# Patient Record
Sex: Female | Born: 1935 | Race: White | Hispanic: No | Marital: Married | Smoking: Never smoker
Health system: Southern US, Community
[De-identification: ages and names within clinical notes are randomized; demographics above are authoritative.]

## PROBLEM LIST (undated history)

## (undated) DIAGNOSIS — E039 Hypothyroidism, unspecified: Secondary | ICD-10-CM

## (undated) DIAGNOSIS — E119 Type 2 diabetes mellitus without complications: Secondary | ICD-10-CM

## (undated) DIAGNOSIS — T8859XA Other complications of anesthesia, initial encounter: Secondary | ICD-10-CM

## (undated) DIAGNOSIS — E7800 Pure hypercholesterolemia, unspecified: Secondary | ICD-10-CM

## (undated) DIAGNOSIS — T4145XA Adverse effect of unspecified anesthetic, initial encounter: Secondary | ICD-10-CM

## (undated) DIAGNOSIS — N189 Chronic kidney disease, unspecified: Secondary | ICD-10-CM

## (undated) DIAGNOSIS — M810 Age-related osteoporosis without current pathological fracture: Secondary | ICD-10-CM

## (undated) HISTORY — PX: ABDOMINAL HYSTERECTOMY: SHX81

---

## 2004-06-05 ENCOUNTER — Ambulatory Visit: Payer: Self-pay | Admitting: Unknown Physician Specialty

## 2005-09-17 ENCOUNTER — Ambulatory Visit: Payer: Self-pay | Admitting: Unknown Physician Specialty

## 2006-10-22 ENCOUNTER — Ambulatory Visit: Payer: Self-pay | Admitting: Unknown Physician Specialty

## 2008-01-14 ENCOUNTER — Ambulatory Visit: Payer: Self-pay | Admitting: Unknown Physician Specialty

## 2009-03-06 ENCOUNTER — Ambulatory Visit: Payer: Self-pay | Admitting: Unknown Physician Specialty

## 2009-08-28 DEATH — deceased

## 2010-05-30 ENCOUNTER — Ambulatory Visit: Payer: Self-pay | Admitting: Unknown Physician Specialty

## 2011-06-11 ENCOUNTER — Ambulatory Visit: Payer: Self-pay | Admitting: Unknown Physician Specialty

## 2013-03-09 ENCOUNTER — Ambulatory Visit: Payer: Self-pay | Admitting: Internal Medicine

## 2014-05-06 ENCOUNTER — Ambulatory Visit
Admit: 2014-05-06 | Disposition: A | Discharge status: Home / Self Care | Payer: Self-pay | Attending: Internal Medicine | Admitting: Internal Medicine

## 2014-07-28 ENCOUNTER — Encounter
Admission: RE | Admit: 2014-07-28 | Discharge: 2014-07-28 | Disposition: A | Discharge status: Home / Self Care | Payer: Medicare Other | Source: Ambulatory Visit | Attending: Ophthalmology | Admitting: Ophthalmology

## 2014-07-28 DIAGNOSIS — E119 Type 2 diabetes mellitus without complications: Secondary | ICD-10-CM | POA: Diagnosis not present

## 2014-07-28 DIAGNOSIS — M810 Age-related osteoporosis without current pathological fracture: Secondary | ICD-10-CM | POA: Diagnosis not present

## 2014-07-28 DIAGNOSIS — E780 Pure hypercholesterolemia: Secondary | ICD-10-CM | POA: Diagnosis not present

## 2014-07-28 DIAGNOSIS — H2511 Age-related nuclear cataract, right eye: Secondary | ICD-10-CM | POA: Diagnosis not present

## 2014-07-28 DIAGNOSIS — I10 Essential (primary) hypertension: Secondary | ICD-10-CM | POA: Diagnosis not present

## 2014-07-28 LAB — POTASSIUM: POTASSIUM: 4.4 mmol/L (ref 3.5–5.1)

## 2014-08-05 DIAGNOSIS — H2511 Age-related nuclear cataract, right eye: Secondary | ICD-10-CM | POA: Diagnosis not present

## 2014-08-07 NOTE — H&P (Signed)
  History and physical was faxed and scanned in.   

## 2014-08-08 ENCOUNTER — Ambulatory Visit: Payer: Medicare Other | Admitting: *Deleted

## 2014-08-08 ENCOUNTER — Encounter
Admission: RE | Disposition: A | Discharge status: Home / Self Care | Payer: Self-pay | Source: Ambulatory Visit | Attending: Ophthalmology

## 2014-08-08 ENCOUNTER — Ambulatory Visit
Admission: RE | Admit: 2014-08-08 | Discharge: 2014-08-08 | Disposition: A | Discharge status: Home / Self Care | Payer: Medicare Other | Source: Ambulatory Visit | Attending: Ophthalmology | Admitting: Ophthalmology

## 2014-08-08 ENCOUNTER — Encounter: Payer: Self-pay | Admitting: *Deleted

## 2014-08-08 DIAGNOSIS — E780 Pure hypercholesterolemia: Secondary | ICD-10-CM | POA: Insufficient documentation

## 2014-08-08 DIAGNOSIS — I10 Essential (primary) hypertension: Secondary | ICD-10-CM | POA: Insufficient documentation

## 2014-08-08 DIAGNOSIS — H2511 Age-related nuclear cataract, right eye: Secondary | ICD-10-CM | POA: Insufficient documentation

## 2014-08-08 DIAGNOSIS — M810 Age-related osteoporosis without current pathological fracture: Secondary | ICD-10-CM | POA: Insufficient documentation

## 2014-08-08 DIAGNOSIS — E119 Type 2 diabetes mellitus without complications: Secondary | ICD-10-CM | POA: Insufficient documentation

## 2014-08-08 HISTORY — DX: Age-related osteoporosis without current pathological fracture: M81.0

## 2014-08-08 HISTORY — DX: Type 2 diabetes mellitus without complications: E11.9

## 2014-08-08 HISTORY — DX: Pure hypercholesterolemia, unspecified: E78.00

## 2014-08-08 HISTORY — DX: Other complications of anesthesia, initial encounter: T88.59XA

## 2014-08-08 HISTORY — PX: CATARACT EXTRACTION W/PHACO: SHX586

## 2014-08-08 HISTORY — DX: Hypothyroidism, unspecified: E03.9

## 2014-08-08 HISTORY — DX: Chronic kidney disease, unspecified: N18.9

## 2014-08-08 HISTORY — DX: Adverse effect of unspecified anesthetic, initial encounter: T41.45XA

## 2014-08-08 LAB — GLUCOSE, CAPILLARY: Glucose-Capillary: 130 mg/dL — ABNORMAL HIGH (ref 65–99)

## 2014-08-08 SURGERY — PHACOEMULSIFICATION, CATARACT, WITH IOL INSERTION
Anesthesia: Monitor Anesthesia Care | Site: Eye | Laterality: Right | Wound class: Clean

## 2014-08-08 MED ORDER — PHENYLEPHRINE HCL 10 % OP SOLN
1.0000 [drp] | OPHTHALMIC | Status: DC | PRN
  Administered 2014-08-08: 1 [drp] via OPHTHALMIC

## 2014-08-08 MED ORDER — MOXIFLOXACIN HCL 0.5 % OP SOLN
OPHTHALMIC | Status: AC
  Administered 2014-08-08: 1 [drp] via OPHTHALMIC
  Filled 2014-08-08: qty 3

## 2014-08-08 MED ORDER — NA CHONDROIT SULF-NA HYALURON 40-17 MG/ML IO SOLN
INTRAOCULAR | Status: AC
  Filled 2014-08-08: qty 1

## 2014-08-08 MED ORDER — ALFENTANIL 500 MCG/ML IJ INJ
INJECTION | INTRAMUSCULAR | Status: DC | PRN
Start: 2014-08-08 — End: 2014-08-08
  Administered 2014-08-08: 500 ug via INTRAVENOUS

## 2014-08-08 MED ORDER — EPINEPHRINE HCL 1 MG/ML IJ SOLN
INTRAMUSCULAR | Status: DC | PRN
  Administered 2014-08-08: .5 mL via OPHTHALMIC

## 2014-08-08 MED ORDER — CEFUROXIME OPHTHALMIC INJECTION 1 MG/0.1 ML
INJECTION | OPHTHALMIC | Status: AC
  Filled 2014-08-08: qty 0.1

## 2014-08-08 MED ORDER — MIDAZOLAM HCL 2 MG/2ML IJ SOLN
INTRAMUSCULAR | Status: DC | PRN
  Administered 2014-08-08: 1 mg via INTRAVENOUS

## 2014-08-08 MED ORDER — CARBACHOL 0.01 % IO SOLN
INTRAOCULAR | Status: DC | PRN
  Administered 2014-08-08: 0.5 mL via INTRAOCULAR

## 2014-08-08 MED ORDER — HYALURONIDASE HUMAN 150 UNIT/ML IJ SOLN
INTRAMUSCULAR | Status: AC
Start: 2014-08-08 — End: 2014-08-08
  Filled 2014-08-08: qty 1

## 2014-08-08 MED ORDER — LIDOCAINE HCL (PF) 4 % IJ SOLN
INTRAMUSCULAR | Status: AC
  Filled 2014-08-08: qty 5

## 2014-08-08 MED ORDER — MOXIFLOXACIN HCL 0.5 % OP SOLN - NO CHARGE
OPHTHALMIC | Status: DC | PRN
  Administered 2014-08-08: 1 [drp] via OPHTHALMIC

## 2014-08-08 MED ORDER — CEFUROXIME OPHTHALMIC INJECTION 1 MG/0.1 ML
INJECTION | OPHTHALMIC | Status: DC | PRN
  Administered 2014-08-08: 0.1 mL via INTRACAMERAL

## 2014-08-08 MED ORDER — EPINEPHRINE HCL 1 MG/ML IJ SOLN
INTRAMUSCULAR | Status: AC
  Filled 2014-08-08: qty 1

## 2014-08-08 MED ORDER — CYCLOPENTOLATE HCL 2 % OP SOLN
1.0000 [drp] | OPHTHALMIC | Status: DC | PRN
  Administered 2014-08-08: 1 [drp] via OPHTHALMIC

## 2014-08-08 MED ORDER — EPINEPHRINE HCL 1 MG/ML IJ SOLN
INTRAOCULAR | Status: DC | PRN
  Administered 2014-08-08: 200 mL

## 2014-08-08 MED ORDER — PHENYLEPHRINE HCL 10 % OP SOLN
OPHTHALMIC | Status: AC
  Administered 2014-08-08: 1 [drp] via OPHTHALMIC
  Filled 2014-08-08: qty 5

## 2014-08-08 MED ORDER — CYCLOPENTOLATE HCL 2 % OP SOLN
OPHTHALMIC | Status: AC
  Administered 2014-08-08: 1 [drp] via OPHTHALMIC
  Filled 2014-08-08: qty 2

## 2014-08-08 MED ORDER — TETRACAINE HCL 0.5 % OP SOLN
OPHTHALMIC | Status: DC | PRN
  Administered 2014-08-08: 1 [drp] via OPHTHALMIC

## 2014-08-08 MED ORDER — SODIUM CHLORIDE 0.9 % IV SOLN
INTRAVENOUS | Status: DC
  Administered 2014-08-08: 07:00:00 via INTRAVENOUS

## 2014-08-08 MED ORDER — MOXIFLOXACIN HCL 0.5 % OP SOLN
1.0000 [drp] | OPHTHALMIC | Status: DC | PRN
  Administered 2014-08-08: 1 [drp] via OPHTHALMIC

## 2014-08-08 MED ORDER — BUPIVACAINE HCL (PF) 0.75 % IJ SOLN
INTRAMUSCULAR | Status: AC
  Filled 2014-08-08: qty 10

## 2014-08-08 MED ORDER — TETRACAINE HCL 0.5 % OP SOLN
OPHTHALMIC | Status: AC
Start: 2014-08-08 — End: 2014-08-08
  Filled 2014-08-08: qty 2

## 2014-08-08 MED ORDER — NA CHONDROIT SULF-NA HYALURON 40-17 MG/ML IO SOLN
INTRAOCULAR | Status: DC | PRN
  Administered 2014-08-08: 1 mL via INTRAOCULAR

## 2014-08-08 MED ORDER — PROPARACAINE HCL 0.5 % OP SOLN: OPHTHALMIC | Status: DC | PRN

## 2014-08-08 SURGICAL SUPPLY — 25 items
CORD BIP STRL DISP 12FT (MISCELLANEOUS) ×3 IMPLANT
DRAPE XRAY CASSETTE 23X24 (DRAPES) ×3 IMPLANT
ERASER HMR WETFIELD 18G (MISCELLANEOUS) ×3 IMPLANT
GLOVE BIO SURGEON STRL SZ8 (GLOVE) ×3 IMPLANT
GLOVE SURG LX 6.5 MICRO (GLOVE) ×2
GLOVE SURG LX 8.0 MICRO (GLOVE) ×2
GLOVE SURG LX STRL 6.5 MICRO (GLOVE) ×1 IMPLANT
GLOVE SURG LX STRL 8.0 MICRO (GLOVE) ×1 IMPLANT
GOWN STRL REUS W/ TWL LRG LVL3 (GOWN DISPOSABLE) ×1 IMPLANT
GOWN STRL REUS W/ TWL XL LVL3 (GOWN DISPOSABLE) ×1 IMPLANT
GOWN STRL REUS W/TWL LRG LVL3 (GOWN DISPOSABLE) ×2
GOWN STRL REUS W/TWL XL LVL3 (GOWN DISPOSABLE) ×2
LENS IOL ACRYSERT 20.0 (Intraocular Lens) ×3 IMPLANT
PACK CATARACT (MISCELLANEOUS) ×3 IMPLANT
PACK CATARACT DINGLEDEIN LX (MISCELLANEOUS) ×3 IMPLANT
PACK EYE AFTER SURG (MISCELLANEOUS) ×3 IMPLANT
SHLD EYE VISITEC  UNIV (MISCELLANEOUS) ×3 IMPLANT
SOL PREP PVP 2OZ (MISCELLANEOUS) ×3
SOLUTION PREP PVP 2OZ (MISCELLANEOUS) ×1 IMPLANT
SUT SILK 5-0 (SUTURE) ×3 IMPLANT
SYR 5ML LL (SYRINGE) ×3 IMPLANT
SYR TB 1ML 27GX1/2 LL (SYRINGE) ×3 IMPLANT
WATER STERILE IRR 1000ML POUR (IV SOLUTION) ×3 IMPLANT
WIPE NON LINTING 3.25X3.25 (MISCELLANEOUS) ×3 IMPLANT
sn6cws 20.0 acrysof lens ×3 IMPLANT

## 2014-08-08 NOTE — Interval H&P Note (Signed)
History and Physical Interval Note:  08/08/2014 7:20 AM  Julie ProudHelen D Olver  has presented today for surgery, with the diagnosis of nuclear sclerotic cataract right eye  The various methods of treatment have been discussed with the patient and family. After consideration of risks, benefits and other options for treatment, the patient has consented to  Procedure(s) with comments: CATARACT EXTRACTION PHACO AND INTRAOCULAR LENS PLACEMENT (IOC) (Right) - US AP% CDE fluid pack lot # [id number] as a surgical intervention .  The patient's history has been reviewed, patient examined, no change in status, stable for surgery.  I have reviewed the patient's chart and labs.  Questions were answered to the patient's satisfaction.     Tosca Pletz

## 2014-08-08 NOTE — Anesthesia Postprocedure Evaluation (Signed)
  Anesthesia Post-op Note  Patient: Julie Buckley  Procedure(s) Performed: Procedure(s) with comments: CATARACT EXTRACTION PHACO AND INTRAOCULAR LENS PLACEMENT (IOC) (Right) - US 01:46 AP% 61.2 CDE 50.75 fluid pack lot # [id number]  Anesthesia type:MAC  Patient location: PACU  Post pain: Pain level controlled  Post assessment: Post-op Vital signs reviewed, Patient's Cardiovascular Status Stable, Respiratory Function Stable, Patent Airway and No signs of Nausea or vomiting  Post vital signs: Reviewed and stable  Last Vitals:  Filed Vitals:   08/08/14 0821  BP: 138/51  Pulse: 65  Temp:   Resp: 16    Level of consciousness: awake, alert  and patient cooperative  Complications: No apparent anesthesia complications

## 2014-08-08 NOTE — Transfer of Care (Signed)
Immediate Anesthesia Transfer of Care Note  Patient: Julie Buckley  Procedure(s) Performed: Procedure(s) with comments: CATARACT EXTRACTION PHACO AND INTRAOCULAR LENS PLACEMENT (IOC) (Right) - US 01:46 AP% 61.2 CDE 50.75 fluid pack lot # [id number]  Patient Location: PACU  Anesthesia Type:MAC  Level of Consciousness: awake  Airway & Oxygen Therapy: Patient Spontanous Breathing and Patient connected to nasal cannula oxygen  Post-op Assessment: Report given to RN and Post -op Vital signs reviewed and stable  Post vital signs:   Last Vitals:  99% hr 65 resp16 bp 138/51 temp 97.2  Complications: No apparent anesthesia complications

## 2014-08-08 NOTE — Op Note (Signed)
Date of Surgery: 08/08/2014 Date of Dictation: 08/08/2014 8:16 AM Pre-operative Diagnosis:  Nuclear Sclerotic Cataract right Eye Post-operative Diagnosis: same Procedure performed: Extra-capsular Cataract Extraction (ECCE) with placement of a posterior chamber intraocular lens (IOL) right Eye IOL:  Implant Name Type Inv. Item Serial No. Manufacturer Lot No. LRB No. Used  [id number] 20.0 acrysof lens     [id number]     Right 1   Anesthesia: 2% Lidocaine and 4% Marcaine in a 50/50 mixture with 10 unites/ml of Hylenex given as a peribulbar Anesthesiologist: Anesthesiologist: Linward NatalAmy Rice, MD CRNA: Henrietta HooverKimberly Pope, CRNA Complications: none Estimated Blood Loss: less than 1 ml  Description of procedure:  The patient was given anesthesia and sedation via intravenous access. The patient was then prepped and draped in the usual fashion. A 25-gauge needle was bent for initiating the capsulorhexis. A 5-0 silk suture was placed through the conjunctiva superior and inferiorly to serve as bridle sutures. Hemostasis was obtained at the superior limbus using an eraser cautery. A partial thickness groove was made at the anterior surgical limbus with a 64 Beaver blade and this was dissected anteriorly with an [employer] Crescent knife. The anterior chamber was entered at 10 o'clock with a 1.0 mm paracentesis knife and through the lamellar dissection with a 2.6 mm Alcon keratome. DiscoVisc was injected to replace the aqueous and a continuous tear curvilinear capsulorhexis was performed using a bent 25-gauge needle.  Balance salt on a syringe was used to perform hydro-dissection and phacoemulsification was carried out using a divide and conquer technique. Procedure(s) with comments: CATARACT EXTRACTION PHACO AND INTRAOCULAR LENS PLACEMENT (IOC) (Right) - US 01:46 AP% 61.2 CDE 50.75 fluid pack lot # [id number]. Irrigation/aspiration was used to remove the residual cortex and the capsular bag was inflated with DiscoVisc. The  intraocular lens was inserted into the capsular bag using a pre-loaded Monarch Delivery System. Irrigation/aspiration was used to remove the residual DiscoVisc. The wound was inflated with balanced salt and checked for leaks. None were found. Miostat was injected via the paracentesis track and 0.1 ml of cefuroxime containing 1 mg of drug  was injected via the paracentesis track. The wound was checked for leaks again and none were found.   The bridal sutures were removed and two drops of Vigamox were placed on the eye. An eye shield was placed to protect the eye and the patient was discharged to the recovery area in good condition.   Lissie Hinesley MD

## 2014-08-08 NOTE — Anesthesia Preprocedure Evaluation (Signed)
Anesthesia Evaluation  Patient identified by MRN, date of birth, ID band Patient awake    Reviewed: Allergy & Precautions, NPO status , Patient's Chart, lab work & pertinent test results  Airway Mallampati: II  TM Distance: >3 FB Neck ROM: Limited    Dental  (+) Upper Dentures   Pulmonary    Pulmonary exam normal       Cardiovascular Exercise Tolerance: Good Normal cardiovascular exam    Neuro/Psych    GI/Hepatic   Endo/Other  diabetes, Type 2BG 130.  Renal/GU      Musculoskeletal   Abdominal Normal abdominal exam  (+)   Peds  Hematology   Anesthesia Other Findings   Reproductive/Obstetrics                             Anesthesia Physical Anesthesia Plan  ASA: III  Anesthesia Plan: MAC   Post-op Pain Management:    Induction: Intravenous  Airway Management Planned: Nasal Cannula  Additional Equipment:   Intra-op Plan:   Post-operative Plan:   Informed Consent: I have reviewed the patients History and Physical, chart, labs and discussed the procedure including the risks, benefits and alternatives for the proposed anesthesia with the patient or authorized representative who has indicated his/her understanding and acceptance.     Plan Discussed with: CRNA  Anesthesia Plan Comments:         Anesthesia Quick Evaluation

## 2014-08-08 NOTE — Anesthesia Procedure Notes (Signed)
Procedure Name: MAC Date/Time: 08/08/2014 7:45 AM Performed by: Henrietta HooverPOPE, Nikkie Liming Pre-anesthesia Checklist: Patient identified, Emergency Drugs available, Suction available, Patient being monitored and Timeout performed Patient Re-evaluated:Patient Re-evaluated prior to inductionOxygen Delivery Method: Nasal cannula

## 2014-08-08 NOTE — Discharge Instructions (Signed)
Cataract Surgery °Care After °Refer to this sheet in the next few weeks. These instructions provide you with information on caring for yourself after your procedure. Your caregiver may also give you more specific instructions. Your treatment has been planned according to current medical practices, but problems sometimes occur. Call your caregiver if you have any problems or questions after your procedure.  °HOME CARE INSTRUCTIONS  °· Avoid strenuous activities as directed by your caregiver. °· Ask your caregiver when you can resume driving. °· Use eyedrops or other medicines to help healing and control pressure inside your eye as directed by your caregiver. °· Only take over-the-counter or prescription medicines for pain, discomfort, or fever as directed by your caregiver. °· Do not to touch or rub your eyes. °· You may be instructed to use a protective shield during the first few days and nights after surgery. If not, wear sunglasses to protect your eyes. This is to protect the eye from pressure or from being accidentally bumped. °· Keep the area around your eye clean and dry. Avoid swimming or allowing water to hit you directly in the face while showering. Keep soap and shampoo out of your eyes. °· Do not bend or lift heavy objects. Bending increases pressure in the eye. You can walk, climb stairs, and do light household chores. °· Do not put a contact lens into the eye that had surgery until your caregiver says it is okay to do so. °· Ask your doctor when you can return to work. This will depend on the kind of work that you do. If you work in a dusty environment, you may be advised to wear protective eyewear for a period of time. °· Ask your caregiver when it will be safe to engage in sexual activity. °· Continue with your regular eye exams as directed by your caregiver. °What to expect: °· It is normal to feel itching and mild discomfort for a few days after cataract surgery. Some fluid discharge is also common,  and your eye may be sensitive to light and touch. °· After 1 to 2 days, even moderate discomfort should disappear. In most cases, healing will take about 6 weeks. °· If you received an intraocular lens (IOL), you may notice that colors are very bright or have a blue tinge. Also, if you have been in bright sunlight, everything may appear reddish for a few hours. If you see these color tinges, it is because your lens is clear and no longer cloudy. Within a few months after receiving an IOL, these extra colors should go away. When you have healed, you will probably need new glasses. °SEEK MEDICAL CARE IF:  °· You have increased bruising around your eye. °· You have discomfort not helped by medicine. °SEEK IMMEDIATE MEDICAL CARE IF:  °· You have a  fever. °· You have a worsening or sudden vision loss. °· You have redness, swelling, or increasing pain in the eye. °· You have a thick discharge from the eye that had surgery. °MAKE SURE YOU: °· Understand these instructions. °· Will watch your condition. °· Will get help right away if you are not doing well or get worse. °Document Released: 08/03/2004 Document Revised: 04/08/2011 Document Reviewed: 09/07/2010 °[employer]® Patient Information ©2015 [employer]. This information is not intended to replace advice given to you by your health care provider. Make sure you discuss any questions you have with your health care provider. ° °

## 2015-03-22 ENCOUNTER — Encounter: Payer: Self-pay | Admitting: Ophthalmology

## 2015-07-14 ENCOUNTER — Other Ambulatory Visit: Payer: Self-pay | Admitting: Internal Medicine

## 2015-07-14 DIAGNOSIS — Z1231 Encounter for screening mammogram for malignant neoplasm of breast: Secondary | ICD-10-CM

## 2015-07-20 ENCOUNTER — Ambulatory Visit
Admission: RE | Admit: 2015-07-20 | Discharge: 2015-07-20 | Disposition: A | Discharge status: Home / Self Care | Payer: Medicare Other | Source: Ambulatory Visit | Attending: Internal Medicine | Admitting: Internal Medicine

## 2015-07-20 ENCOUNTER — Other Ambulatory Visit: Payer: Self-pay | Admitting: Internal Medicine

## 2015-07-20 DIAGNOSIS — Z1231 Encounter for screening mammogram for malignant neoplasm of breast: Secondary | ICD-10-CM | POA: Insufficient documentation

## 2016-05-30 ENCOUNTER — Other Ambulatory Visit: Payer: Self-pay | Admitting: Internal Medicine

## 2016-05-30 DIAGNOSIS — Z1231 Encounter for screening mammogram for malignant neoplasm of breast: Secondary | ICD-10-CM

## 2016-07-22 ENCOUNTER — Ambulatory Visit
Admission: RE | Admit: 2016-07-22 | Discharge: 2016-07-22 | Disposition: A | Discharge status: Home / Self Care | Payer: Medicare Other | Source: Ambulatory Visit | Attending: Internal Medicine | Admitting: Internal Medicine

## 2016-07-22 DIAGNOSIS — Z1231 Encounter for screening mammogram for malignant neoplasm of breast: Secondary | ICD-10-CM | POA: Insufficient documentation

## 2017-05-14 ENCOUNTER — Other Ambulatory Visit: Payer: Self-pay | Admitting: Internal Medicine

## 2017-05-14 DIAGNOSIS — Z1231 Encounter for screening mammogram for malignant neoplasm of breast: Secondary | ICD-10-CM

## 2017-08-05 ENCOUNTER — Ambulatory Visit
Admission: RE | Admit: 2017-08-05 | Discharge: 2017-08-05 | Disposition: A | Discharge status: Home / Self Care | Payer: Medicare Other | Source: Ambulatory Visit | Attending: Internal Medicine | Admitting: Internal Medicine

## 2017-08-05 DIAGNOSIS — Z1231 Encounter for screening mammogram for malignant neoplasm of breast: Secondary | ICD-10-CM | POA: Diagnosis present

## 2018-08-26 ENCOUNTER — Other Ambulatory Visit: Payer: Self-pay | Admitting: Physician Assistant

## 2018-08-26 DIAGNOSIS — Z1231 Encounter for screening mammogram for malignant neoplasm of breast: Secondary | ICD-10-CM

## 2018-10-01 ENCOUNTER — Ambulatory Visit
Admission: RE | Admit: 2018-10-01 | Discharge: 2018-10-01 | Disposition: A | Discharge status: Home / Self Care | Payer: Medicare Other | Source: Ambulatory Visit | Attending: Physician Assistant | Admitting: Physician Assistant

## 2018-10-01 DIAGNOSIS — Z1231 Encounter for screening mammogram for malignant neoplasm of breast: Secondary | ICD-10-CM

## 2019-08-26 ENCOUNTER — Other Ambulatory Visit: Payer: Self-pay | Admitting: Physician Assistant

## 2019-08-26 DIAGNOSIS — Z1231 Encounter for screening mammogram for malignant neoplasm of breast: Secondary | ICD-10-CM

## 2019-10-07 ENCOUNTER — Ambulatory Visit
Admission: RE | Admit: 2019-10-07 | Discharge: 2019-10-07 | Disposition: A | Discharge status: Home / Self Care | Payer: Medicare Other | Source: Ambulatory Visit | Attending: Physician Assistant | Admitting: Physician Assistant

## 2019-10-07 DIAGNOSIS — Z1231 Encounter for screening mammogram for malignant neoplasm of breast: Secondary | ICD-10-CM | POA: Diagnosis not present

## 2021-01-10 ENCOUNTER — Other Ambulatory Visit: Payer: Self-pay

## 2021-01-10 ENCOUNTER — Emergency Department
Admission: EM | Admit: 2021-01-10 | Discharge: 2021-01-10 | Disposition: A | Discharge status: Home / Self Care | Payer: Medicare Other | Attending: Emergency Medicine | Admitting: Emergency Medicine

## 2021-01-10 ENCOUNTER — Emergency Department: Payer: Medicare Other

## 2021-01-10 DIAGNOSIS — Y93K1 Activity, walking an animal: Secondary | ICD-10-CM | POA: Diagnosis not present

## 2021-01-10 DIAGNOSIS — S0181XA Laceration without foreign body of other part of head, initial encounter: Secondary | ICD-10-CM

## 2021-01-10 DIAGNOSIS — E1122 Type 2 diabetes mellitus with diabetic chronic kidney disease: Secondary | ICD-10-CM | POA: Insufficient documentation

## 2021-01-10 DIAGNOSIS — E039 Hypothyroidism, unspecified: Secondary | ICD-10-CM | POA: Diagnosis not present

## 2021-01-10 DIAGNOSIS — W19XXXA Unspecified fall, initial encounter: Secondary | ICD-10-CM

## 2021-01-10 DIAGNOSIS — N189 Chronic kidney disease, unspecified: Secondary | ICD-10-CM | POA: Diagnosis not present

## 2021-01-10 DIAGNOSIS — Z79899 Other long term (current) drug therapy: Secondary | ICD-10-CM | POA: Insufficient documentation

## 2021-01-10 DIAGNOSIS — Z23 Encounter for immunization: Secondary | ICD-10-CM | POA: Diagnosis not present

## 2021-01-10 DIAGNOSIS — S0990XA Unspecified injury of head, initial encounter: Secondary | ICD-10-CM | POA: Diagnosis present

## 2021-01-10 DIAGNOSIS — W01198A Fall on same level from slipping, tripping and stumbling with subsequent striking against other object, initial encounter: Secondary | ICD-10-CM | POA: Insufficient documentation

## 2021-01-10 MED ORDER — CEPHALEXIN 500 MG PO CAPS: 1000.0000 mg | ORAL_CAPSULE | Freq: Two times a day (BID) | ORAL | 0 refills | Status: AC

## 2021-01-10 MED ORDER — SULFAMETHOXAZOLE-TRIMETHOPRIM 800-160 MG PO TABS
1.0000 | ORAL_TABLET | Freq: Once | ORAL | Status: AC
  Administered 2021-01-10: 1 via ORAL
  Filled 2021-01-10: qty 1

## 2021-01-10 MED ORDER — LIDOCAINE-EPINEPHRINE (PF) 2 %-1:200000 IJ SOLN
10.0000 mL | Freq: Once | INTRAMUSCULAR | Status: AC
  Administered 2021-01-10: 10 mL
  Filled 2021-01-10: qty 20

## 2021-01-10 MED ORDER — SULFAMETHOXAZOLE-TRIMETHOPRIM 800-160 MG PO TABS: 1.0000 | ORAL_TABLET | Freq: Two times a day (BID) | ORAL | 0 refills | Status: AC

## 2021-01-10 MED ORDER — CEPHALEXIN 500 MG PO CAPS
500.0000 mg | ORAL_CAPSULE | Freq: Once | ORAL | Status: AC
  Administered 2021-01-10: 500 mg via ORAL
  Filled 2021-01-10: qty 1

## 2021-01-10 MED ORDER — TETANUS-DIPHTH-ACELL PERTUSSIS 5-2.5-18.5 LF-MCG/0.5 IM SUSY
0.5000 mL | PREFILLED_SYRINGE | Freq: Once | INTRAMUSCULAR | Status: AC
  Administered 2021-01-10: 22:00:00 0.5 mL via INTRAMUSCULAR
  Filled 2021-01-10: qty 0.5

## 2021-01-10 NOTE — ED Notes (Signed)
Patient verbalizes understanding of discharge instructions. Opportunity for questioning and answers were provided. Armband removed by staff, pt discharged from ED. Wheeled out to lobby, daughter driving pt home

## 2021-01-10 NOTE — ED Triage Notes (Signed)
Pt comes with c/o mechancal fall. Pt states she fell forward onto ground. Pt has large open wound noted above right eye. Pt denies any LOC. Pt is not on thinners.  Pt also has large bruise noted to right top of hand. Pt states head pain.

## 2021-01-10 NOTE — ED Provider Notes (Signed)
[facility] Emergency Department Provider Note  ____________________________________________  Time seen: Approximately 9:29 PM  I have reviewed the triage vital signs and the nursing notes.   HISTORY  Chief Complaint Fall    HPI Julie Buckley is a 86 y.o. female who presents the emergency department with her daughter after sustaining a mechanical fall.  Patient was walking her dog when she tripped, fell and struck her head on the ground.  She arrived with a large laceration to the forehead.  There is no loss of consciousness at time of or subsequently.  She does have a slight headache.  No neck pain.  Patient has had no drainage from the ears or nose.  No visual changes.  No other complaint at this time.  Patient was evaluated in triage, had imaging placed from triage.  Tetanus shot is not up-to-date and will be updated today.       Past Medical History:  Diagnosis Date   Chronic kidney disease    Complication of anesthesia    significant drop in BP in PACU (possibly assoc. with bleeding)   Diabetes mellitus without complication (HCC)    Hypercholesteremia    Hypothyroidism    Osteoporosis     There are no problems to display for this patient.   Past Surgical History:  Procedure Laterality Date   ABDOMINAL HYSTERECTOMY     CATARACT EXTRACTION W/PHACO Right 08/08/2014   Procedure: CATARACT EXTRACTION PHACO AND INTRAOCULAR LENS PLACEMENT (IOC);  Surgeon: Sallee Lange, MD;  Location: [facility] ORS;  Service: Ophthalmology;  Laterality: Right;  [place] 01:46 AP% 61.2 CDE 50.75 fluid pack lot # [id number]    Prior to Admission medications   Medication Sig Start Date End Date Taking? Authorizing Provider  cephALEXin (KEFLEX) 500 MG capsule Take 2 capsules (1,000 mg total) by mouth 2 (two) times daily. 01/10/21  Yes Loreena Valeri, Delorise Royals, PA-C  sulfamethoxazole-trimethoprim (BACTRIM DS) 800-160 MG tablet Take 1 tablet by mouth 2 (two) times daily. 01/10/21  Yes  Ellon Marasco, Delorise Royals, PA-C  alendronate (FOSAMAX) 70 MG tablet Take 70 mg by mouth once a week. Take with a full glass of water on an empty stomach.    [provider]  captopril (CAPOTEN) 50 MG tablet Take 50 mg by mouth 3 (three) times daily.    [provider]  gemfibrozil (LOPID) 600 MG tablet Take 600 mg by mouth 2 (two) times daily before a meal.    [provider]  hydrochlorothiazide (HYDRODIURIL) 25 MG tablet Take 25 mg by mouth daily.    [provider]  levothyroxine (SYNTHROID, LEVOTHROID) 88 MCG tablet Take 88 mcg by mouth daily before breakfast.    [provider]  pravastatin (PRAVACHOL) 10 MG tablet Take 10 mg by mouth at bedtime.    [provider]    Allergies Patient has no known allergies.  Family History  Problem Relation Age of Onset   Breast cancer Neg Hx     Social History     Review of Systems  Constitutional: No fever/chills Eyes: No visual changes. No discharge ENT: No upper respiratory complaints. Cardiovascular: no chest pain. Respiratory: no cough. No SOB. Gastrointestinal: No abdominal pain.  No nausea, no vomiting.  No diarrhea.  No constipation. Musculoskeletal: Negative for musculoskeletal pain. Skin: Significant laceration to the right forehead.  This is V-shaped and is obviously contaminated with gravel and rocks. Neurological: Negative for headaches, focal weakness or numbness.  10 System ROS otherwise negative.  ____________________________________________  PHYSICAL EXAM:  VITAL SIGNS: ED Triage Vitals  Enc Vitals Group     BP 01/10/21 1735 (!) 197/72     Pulse Rate 01/10/21 1735 77     Resp 01/10/21 1735 18     Temp 01/10/21 1735 98 F (36.7 C)     Temp src --      SpO2 01/10/21 1735 96 %     Weight --      Height --      Head Circumference --      Peak Flow --      Pain Score 01/10/21 1732 6     Pain Loc --      Pain Edu? --      Excl. in GC? --       Constitutional: Alert and oriented. Well appearing and in no acute distress. Eyes: Conjunctivae are normal. PERRL. EOMI. Head: Atraumatic.  Patient has a V-shaped laceration measuring approximately 8 cm in length.  Patient has obvious contamination of this wound with gravel, twigs and grass.  Bleeding is poorly controlled at this time.  Patient has surrounding edema and ecchymosis but no other obvious signs of trauma to the skull or face.  Other than laceration no tenderness to the skull or face.  No palpable abnormality or crepitus.  Patient has periorbital edema and ecchymosis around the right eye inferior to this wound but there is no raccoon eyes and no battle signs. ENT:      Ears:       Nose: No congestion/rhinnorhea.      Mouth/Throat: Mucous membranes are moist.  Neck: No stridor.  No cervical spine tenderness to palpation.  Cardiovascular: Normal rate, regular rhythm. Normal S1 and S2.  Good peripheral circulation. Respiratory: Normal respiratory effort without tachypnea or retractions. Lungs CTAB. Good air entry to the bases with no decreased or absent breath sounds. Musculoskeletal: Full range of motion to all extremities. No gross deformities appreciated. Neurologic:  Normal speech and language. No gross focal neurologic deficits are appreciated.  Cranial nerves II to XII grossly intact Skin:  Skin is warm, dry and intact. No rash noted. Psychiatric: Mood and affect are normal. Speech and behavior are normal. Patient exhibits appropriate insight and judgement.   ____________________________________________   LABS (all labs ordered are listed, but only abnormal results are displayed)  Labs Reviewed - No data to display ____________________________________________  EKG   ____________________________________________  RADIOLOGY I personally viewed and evaluated these images as part of my medical decision making, as well as reviewing the written report by the  radiologist.  ED Provider Interpretation: Soft tissue injury identified with possible foreign body.  However no underlying intracranial abnormality or fracture  CT HEAD WO CONTRAST ( )  Result Date: 01/10/2021 CLINICAL DATA:  Larey Seat, right supraorbital laceration EXAM: CT HEAD WITHOUT CONTRAST TECHNIQUE: Contiguous axial images were obtained from the base of the skull through the vertex without intravenous contrast. COMPARISON:  None. FINDINGS: Brain: No acute infarct or hemorrhage. Lateral ventricles and midline structures are unremarkable. No acute extra-axial fluid collections. No mass effect. Vascular: No hyperdense vessel or unexpected calcification. Skull: Right supraorbital scalp laceration. No underlying fracture. The remainder of the calvarium is unremarkable. Sinuses/Orbits: No acute finding. Other: None. IMPRESSION: 1. Right supraorbital scalp laceration. 2. No acute intracranial process. Electronically Signed   By: Sharlet Salina M.D.   On: 01/10/2021 18:45   CT Cervical Spine Wo Contrast  Result Date: 01/10/2021 CLINICAL DATA:  Larey Seat, right scalp laceration EXAM: CT CERVICAL  SPINE WITHOUT CONTRAST TECHNIQUE: Multidetector CT imaging of the cervical spine was performed without intravenous contrast. Multiplanar CT image reconstructions were also generated. COMPARISON:  None. FINDINGS: Alignment: Alignment is grossly anatomic. Skull base and vertebrae: No acute fracture. No primary bone lesion or focal pathologic process. Soft tissues and spinal canal: No prevertebral fluid or swelling. No visible canal hematoma. Disc levels: Prominent spondylosis at C5-6 with left greater than right neural foraminal narrowing as result of disc osteophyte complex. There is severe facet hypertrophic change from C2 through C5. Bony fusion across the left C2-3 as well as the left C4-5 facet joints. Upper chest: Airway is patent.  Lung apices are clear. Other: Reconstructed images demonstrate no additional findings.  IMPRESSION: 1. No acute cervical spine fracture. 2. Multilevel spondylosis and facet hypertrophy as above. Electronically Signed   By: Sharlet Salina M.D.   On: 01/10/2021 18:49   CT Maxillofacial Wo Contrast  Result Date: 01/10/2021 CLINICAL DATA:  Larey Seat, right supraorbital laceration EXAM: CT MAXILLOFACIAL WITHOUT CONTRAST TECHNIQUE: Multidetector CT imaging of the maxillofacial structures was performed. Multiplanar CT image reconstructions were also generated. COMPARISON:  None. FINDINGS: Osseous: No fracture or mandibular dislocation. No destructive process. Orbits: Negative. No traumatic or inflammatory finding. Postsurgical changes right globe. Sinuses: Clear. Soft tissues: There is a large laceration in the right supraorbital soft tissues. There are punctate radiopaque foreign bodies within the laceration site. Remaining soft tissues are unremarkable. Limited intracranial: No significant or unexpected finding. IMPRESSION: 1. Large right supraorbital soft tissue laceration, with punctate radiopaque foreign bodies at the laceration site. 2. No acute facial bone fracture. Electronically Signed   By: Sharlet Salina M.D.   On: 01/10/2021 18:48    ____________________________________________    PROCEDURES  Procedure(s) performed:    Marland KitchenMarland KitchenLaceration Repair  Date/Time: 01/10/2021 10:57 PM Performed by: Racheal Patches, PA-C Authorized by: Racheal Patches, PA-C   Consent:    Consent obtained:  Verbal   Consent given by:  Patient   Risks discussed:  Infection, pain, poor cosmetic result, poor wound healing, need for additional repair and retained foreign body Universal protocol:    Procedure explained and questions answered to patient or proxy's satisfaction: yes     Immediately prior to procedure, a time out was called: yes     Patient identity confirmed:  Verbally with patient Anesthesia:    Anesthesia method:  Local infiltration   Local anesthetic:  Lidocaine 1% WITH  epi Laceration details:    Location:  Face   Face location:  Forehead   Length (cm):  9 Pre-procedure details:    Preparation:  Patient was prepped and draped in usual sterile fashion and imaging obtained to evaluate for foreign bodies Exploration:    Limited defect created (wound extended): yes     Hemostasis achieved with:  Direct pressure and epinephrine   Imaging obtained comment:  CT   Imaging outcome: foreign body noted     Wound exploration: wound explored through full range of motion and entire depth of wound visualized     Wound extent: foreign bodies/material and nerve damage     Wound extent: no tendon damage noted, no underlying fracture noted and no vascular damage noted     Foreign bodies/material:  Dirt, gravel, grass, twigs   Contaminated: yes   Treatment:    Area cleansed with:  Povidone-iodine and saline   Amount of cleaning:  Extensive   Irrigation solution:  Sterile saline   Irrigation volume:  2L   Irrigation  method:  Syringe   Visualized foreign bodies/material removed: yes     Debridement:  Extensive   Undermining:  None   Layers/structures repaired:  Deep subcutaneous Deep subcutaneous:    Suture size:  5-0   Wound subcutaneous closure material used: monocryl.   Suture technique:  Simple interrupted and running   Number of sutures:  9 (8 deep simple interrupted, 1 running subcutaneous suture) Skin repair:    Repair method:  Sutures   Suture size:  5-0   Suture material:  Nylon   Suture technique:  Running locked   Number of sutures:  1 (1 running interlock suture with 18 throws) Approximation:    Approximation:  Close Repair type:    Repair type:  Complex Post-procedure details:    Dressing:  Open (no dressing)   Procedure completion:  Tolerated well, no immediate complications Comments:     Wound was significantly contaminated with dirt, gravel, twigs and grass.  All visualized foreign material was removed.  I flushed with 2 L of saline, and did  3 rounds of Betadine application with flushing in between.  There is no identified retained foreign body at this time.  Wound is closed as described above.    Medications  sulfamethoxazole-trimethoprim (BACTRIM DS) 800-160 MG per tablet 1 tablet (has no administration in time range)  cephALEXin (KEFLEX) capsule 500 mg (has no administration in time range)  lidocaine-EPINEPHrine (XYLOCAINE W/EPI) 2 %-1:200000 (PF) injection 10 mL (10 mLs Infiltration Given 01/10/21 2204)  Tdap (BOOSTRIX) injection 0.5 mL (0.5 mLs Intramuscular Given 01/10/21 2205)     ____________________________________________   INITIAL IMPRESSION / ASSESSMENT AND PLAN / ED COURSE  Pertinent labs & imaging results that were available during my care of the patient were reviewed by me and considered in my medical decision making (see chart for details).  Review of the [place] CSRS was performed in accordance of the [facility] prior to dispensing any controlled drugs.           Patient's diagnosis is consistent with fall, forehead laceration.  Patient presents to the emergency department for mechanical fall with her rather substantial laceration to the right forehead.  This was obviously contaminated.  Imaging revealed possible foreign body as well as soft tissue injury but no underlying intracranial abnormality.  Extensive cleaning was undertaken of this wound.  Given the amount of contamination I will double cover the patient with antibiotics.  I have given strict return precautions for any signs of infection.  Patient's tetanus shot was updated today.  Wound was closed as described above but again given the amount of contamination there is a concern for possible chance of infection.  Follow-up primary care for suture removal, return to the emergency department for any concerning signs or symptoms..  Patient is given ED precautions to return to the ED for any worsening or new  symptoms.     ____________________________________________  FINAL CLINICAL IMPRESSION(S) / ED DIAGNOSES  Final diagnoses:  Fall, initial encounter  Laceration of forehead, initial encounter      NEW MEDICATIONS STARTED DURING THIS VISIT:  ED Discharge Orders          Ordered    cephALEXin (KEFLEX) 500 MG capsule  2 times daily        01/10/21 2324    sulfamethoxazole-trimethoprim (BACTRIM DS) 800-160 MG tablet  2 times daily        01/10/21 2324  This chart was dictated using voice recognition software/Dragon. Despite best efforts to proofread, errors can occur which can change the meaning. Any change was purely unintentional.    Racheal Patches, PA-C 01/10/21 2333    Delton Prairie, MD 01/10/21 [phone]

## 2021-01-20 ENCOUNTER — Other Ambulatory Visit: Payer: Self-pay

## 2021-01-20 ENCOUNTER — Encounter: Payer: Self-pay | Admitting: Intensive Care

## 2021-01-20 ENCOUNTER — Emergency Department
Admission: EM | Admit: 2021-01-20 | Discharge: 2021-01-20 | Disposition: A | Discharge status: Home / Self Care | Payer: Medicare Other | Attending: Emergency Medicine | Admitting: Emergency Medicine

## 2021-01-20 DIAGNOSIS — E039 Hypothyroidism, unspecified: Secondary | ICD-10-CM | POA: Diagnosis not present

## 2021-01-20 DIAGNOSIS — Z79899 Other long term (current) drug therapy: Secondary | ICD-10-CM | POA: Diagnosis not present

## 2021-01-20 DIAGNOSIS — Z7984 Long term (current) use of oral hypoglycemic drugs: Secondary | ICD-10-CM | POA: Insufficient documentation

## 2021-01-20 DIAGNOSIS — N189 Chronic kidney disease, unspecified: Secondary | ICD-10-CM | POA: Insufficient documentation

## 2021-01-20 DIAGNOSIS — E119 Type 2 diabetes mellitus without complications: Secondary | ICD-10-CM | POA: Diagnosis not present

## 2021-01-20 DIAGNOSIS — L089 Local infection of the skin and subcutaneous tissue, unspecified: Secondary | ICD-10-CM | POA: Insufficient documentation

## 2021-01-20 DIAGNOSIS — T148XXA Other injury of unspecified body region, initial encounter: Secondary | ICD-10-CM

## 2021-01-20 MED ORDER — DOXYCYCLINE HYCLATE 100 MG PO TABS: 100.0000 mg | ORAL_TABLET | Freq: Two times a day (BID) | ORAL | 0 refills | Status: AC

## 2021-01-20 NOTE — ED Triage Notes (Signed)
Patient presents with wound infection. Fell X2 weeks ago and stitches placed. Right forehead red and oozing yellow drainage around stitches

## 2021-01-20 NOTE — ED Provider Notes (Signed)
[facility] Emergency Department Provider Note   ____________________________________________    I have reviewed the triage vital signs and the nursing notes.   HISTORY  Chief Complaint Fall     HPI Julie Buckley is a 86 y.o. female who had a fall 2 weeks ago with forehead injury which required suturing, noted contaminated wound however wound was thoroughly rinsed and cleaned and patient was started on antibiotics  Reports discharge noted this morning and some swelling over the last 24 hours.  No fevers reported reports compliance with antibiotics  Past Medical History:  Diagnosis Date   Chronic kidney disease    Complication of anesthesia    significant drop in BP in PACU (possibly assoc. with bleeding)   Diabetes mellitus without complication (HCC)    Hypercholesteremia    Hypothyroidism    Osteoporosis     There are no problems to display for this patient.   Past Surgical History:  Procedure Laterality Date   ABDOMINAL HYSTERECTOMY     CATARACT EXTRACTION W/PHACO Right 08/08/2014   Procedure: CATARACT EXTRACTION PHACO AND INTRAOCULAR LENS PLACEMENT (IOC);  Surgeon: Sallee Lange, MD;  Location: [facility] ORS;  Service: Ophthalmology;  Laterality: Right;  [place] 01:46 AP% 61.2 CDE 50.75 fluid pack lot # [id number]    Prior to Admission medications   Medication Sig Start Date End Date Taking? Authorizing Provider  doxycycline (VIBRA-TABS) 100 MG tablet Take 1 tablet (100 mg total) by mouth 2 (two) times daily. 01/20/21  Yes Jene Every, MD  alendronate (FOSAMAX) 70 MG tablet Take 70 mg by mouth once a week. Take with a full glass of water on an empty stomach.    [provider]  captopril (CAPOTEN) 50 MG tablet Take 50 mg by mouth 3 (three) times daily.    [provider]  cephALEXin (KEFLEX) 500 MG capsule Take 2 capsules (1,000 mg total) by mouth 2 (two) times daily. 01/10/21   Cuthriell, Delorise Royals, PA-C  gemfibrozil  (LOPID) 600 MG tablet Take 600 mg by mouth 2 (two) times daily before a meal.    [provider]  hydrochlorothiazide (HYDRODIURIL) 25 MG tablet Take 25 mg by mouth daily.    [provider]  levothyroxine (SYNTHROID, LEVOTHROID) 88 MCG tablet Take 88 mcg by mouth daily before breakfast.    [provider]  pravastatin (PRAVACHOL) 10 MG tablet Take 10 mg by mouth at bedtime.    [provider]  sulfamethoxazole-trimethoprim (BACTRIM DS) 800-160 MG tablet Take 1 tablet by mouth 2 (two) times daily. 01/10/21   Cuthriell, Delorise Royals, PA-C     Allergies Patient has no known allergies.  Family History  Problem Relation Age of Onset   Breast cancer Neg Hx     Social History Social History   Tobacco Use   Smoking status: Never   Smokeless tobacco: Never    Review of Systems  Constitutional: No fever/chills     Gastrointestinal:  No nausea, no vomiting.    Skin: As above Neurological: Negative for headaches     ____________________________________________   PHYSICAL EXAM:  VITAL SIGNS: ED Triage Vitals  Enc Vitals Group     BP 01/20/21 0856 (!) 144/44     Pulse Rate 01/20/21 0856 73     Resp 01/20/21 0856 20     Temp 01/20/21 0856 97.8 F (36.6 C)     Temp Source 01/20/21 0856 Oral     SpO2 01/20/21 0856 100 %  Weight 01/20/21 0847 68.9 kg (152 lb)     Height 01/20/21 0847 1.651 m (5\' 5" )     Head Circumference --      Peak Flow --      Pain Score 01/20/21 0847 0     Pain Loc --      Pain Edu? --      Excl. in GC? --      Constitutional: Alert and oriented. No acute distress. Pleasant and interactive Eyes: Conjunctivae are normal.  Head: Swelling noted underneath sutures right forehead. Nose: No congestion/rhinnorhea. Mouth/Throat: Mucous membranes are moist.   Cardiovascular: Normal rate, regular rhythm.  Respiratory: Normal respiratory effort.  No retractions. Genitourinary: deferred Musculoskeletal: No lower  extremity tenderness nor edema.   Neurologic:  Normal speech and language. No gross focal neurologic deficits are appreciated.   Skin:  Skin is warm, dry, sutured wound to the right forehead is fluctuant and I am able to express significant purulent discharge with pressure, no significant erythema extending from the wound   ____________________________________________   LABS (all labs ordered are listed, but only abnormal results are displayed)  Labs Reviewed - No data to display ____________________________________________  EKG   ____________________________________________  RADIOLOGY   ____________________________________________   PROCEDURES  Procedure(s) performed: yes  Sutures removed  Procedures   Critical Care performed: No ____________________________________________   INITIAL IMPRESSION / ASSESSMENT AND PLAN / ED COURSE  Pertinent labs & imaging results that were available during my care of the patient were reviewed by me and considered in my medical decision making (see chart for details).   Patient presents with wound infection as noted above.  Sutures removed.  Small 1 cm area has not closed and is draining purulent discharge.  Expressed significant pus from the wound.  We will start the patient on doxycycline, I have expressed to the patient and daughter that she will need close follow-up to ensure improvement which I suspect will be gradual   ____________________________________________   FINAL CLINICAL IMPRESSION(S) / ED DIAGNOSES  Final diagnoses:  Wound infection      NEW MEDICATIONS STARTED DURING THIS VISIT:  Discharge Medication List as of 01/20/2021 10:20 AM     START taking these medications   Details  doxycycline (VIBRA-TABS) 100 MG tablet Take 1 tablet (100 mg total) by mouth 2 (two) times daily., Starting Sat 01/20/2021, Normal         Note:  This document was prepared using Dragon voice recognition software and may  include unintentional dictation errors.    01/22/2021, MD 01/20/21 1201

## 2021-05-29 ENCOUNTER — Other Ambulatory Visit: Payer: Self-pay | Admitting: Physician Assistant

## 2021-05-29 DIAGNOSIS — Z1231 Encounter for screening mammogram for malignant neoplasm of breast: Secondary | ICD-10-CM

## 2022-04-23 ENCOUNTER — Ambulatory Visit
Admission: RE | Admit: 2022-04-23 | Discharge: 2022-04-23 | Disposition: A | Discharge status: Home / Self Care | Payer: Medicare Other | Source: Ambulatory Visit | Attending: Physician Assistant | Admitting: Physician Assistant

## 2022-04-23 DIAGNOSIS — Z1231 Encounter for screening mammogram for malignant neoplasm of breast: Secondary | ICD-10-CM | POA: Insufficient documentation

## 2023-04-24 ENCOUNTER — Other Ambulatory Visit: Payer: Self-pay | Admitting: Physician Assistant

## 2023-04-24 DIAGNOSIS — Z1231 Encounter for screening mammogram for malignant neoplasm of breast: Secondary | ICD-10-CM

## 2023-05-27 ENCOUNTER — Encounter

## 2023-06-03 ENCOUNTER — Ambulatory Visit
Admission: RE | Admit: 2023-06-03 | Discharge: 2023-06-03 | Disposition: A | Discharge status: Home / Self Care | Source: Ambulatory Visit | Attending: Physician Assistant | Admitting: Physician Assistant

## 2023-06-03 DIAGNOSIS — Z1231 Encounter for screening mammogram for malignant neoplasm of breast: Secondary | ICD-10-CM | POA: Diagnosis present

## 2023-06-03 IMAGING — CT CT HEAD W/O CM
5 of 8 series · 17 of 47 positions shown, 18 images · non-contrast
Comparison: None.

CLINICAL DATA: Fell, right supraorbital laceration

EXAM:
CT HEAD WITHOUT CONTRAST
TECHNIQUE: Contiguous axial images were obtained from the base of the skull
through the vertex without intravenous contrast.

[Series 2: head bone · axial · 0.44mm/px · z∈[-147,-29]mm · 8 of 75 slices shown]
[im 8/75  bone]
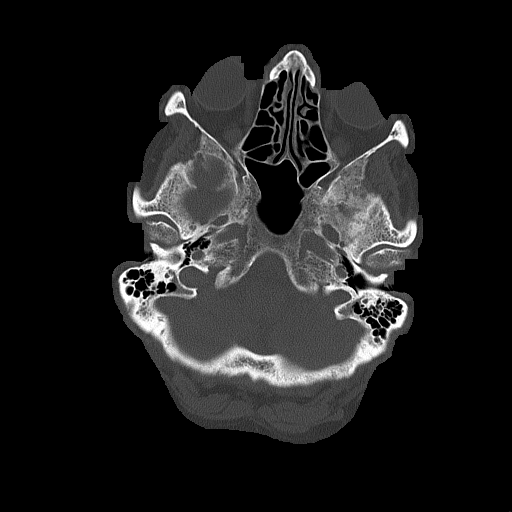
[im 15/75  bone]
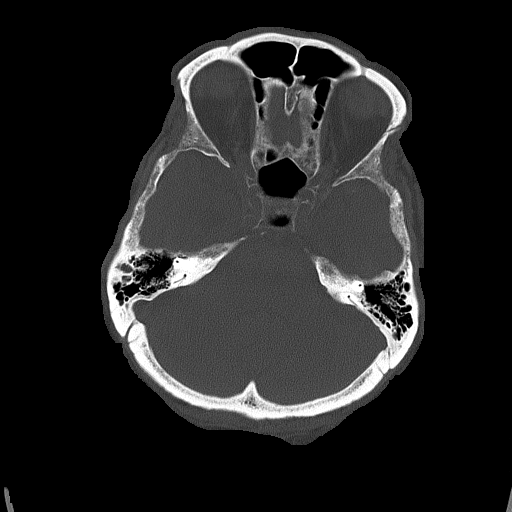
[im 23/75  bone]
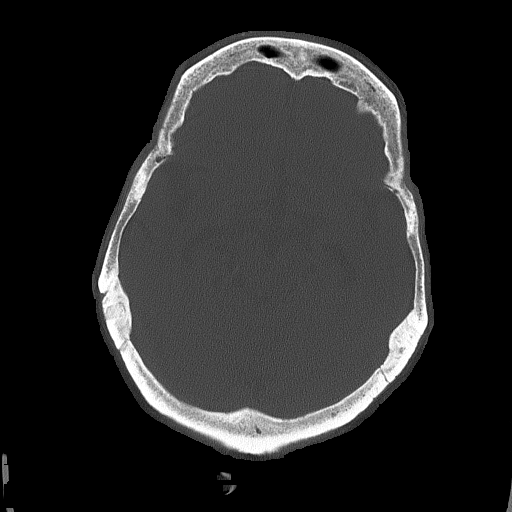
[im 30/75  bone]
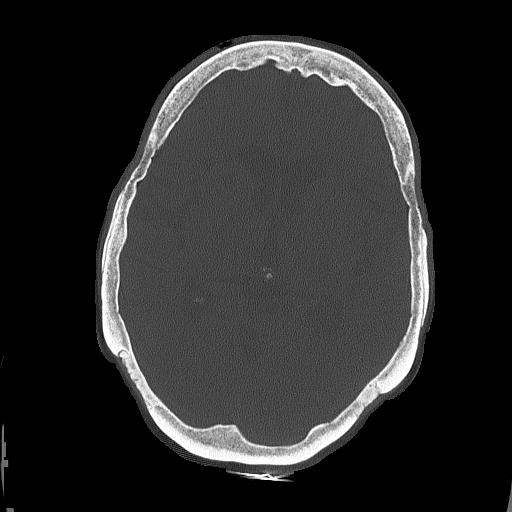
[im 45/75  bone]
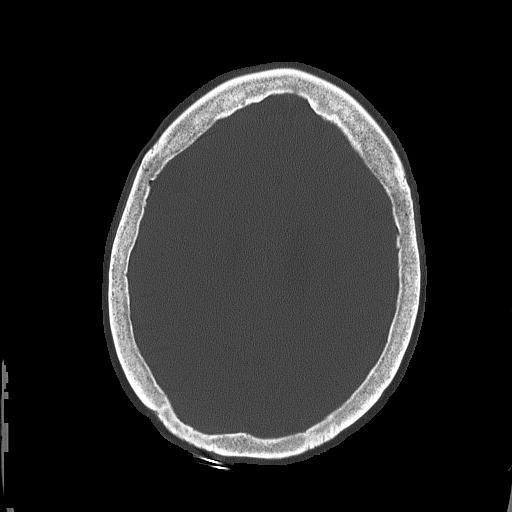
[im 52/75  bone]
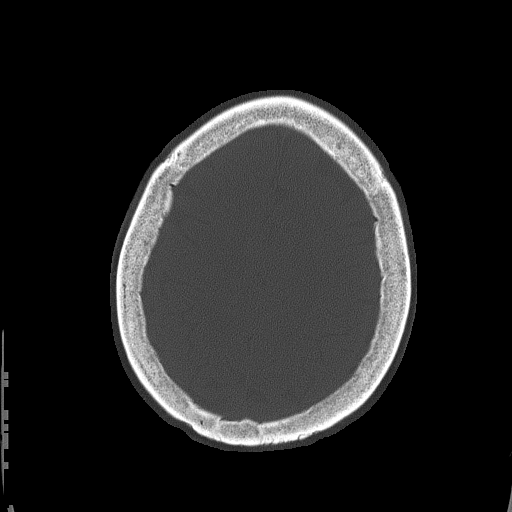
[im 60/75  bone]
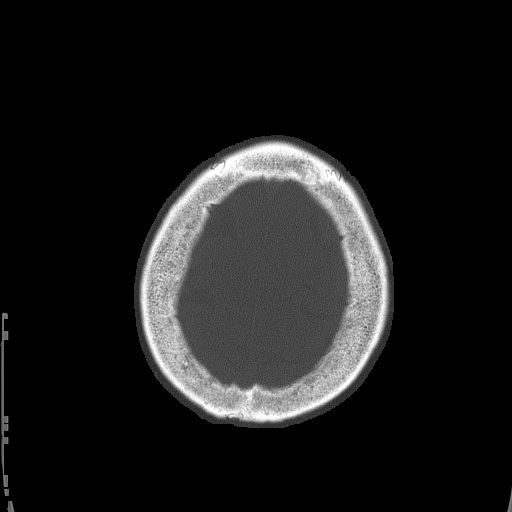
[im 67/75  bone]
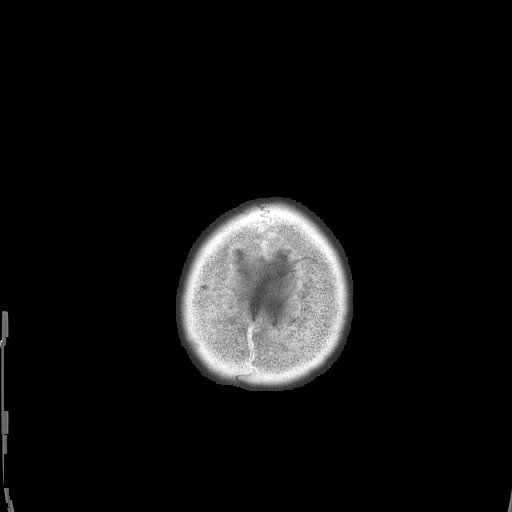

[Series 3: head wo · axial · 0.44mm/px · z∈[-116,-66]mm · 2 of 30 slices shown, 3 images (1 of 2)]
[im 10/30  brain]
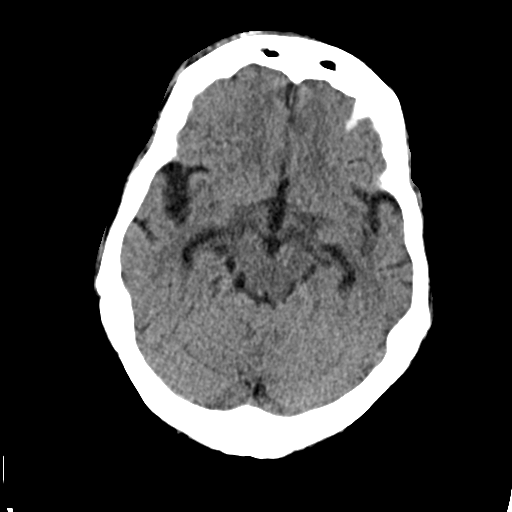
[im 10/30  bone]
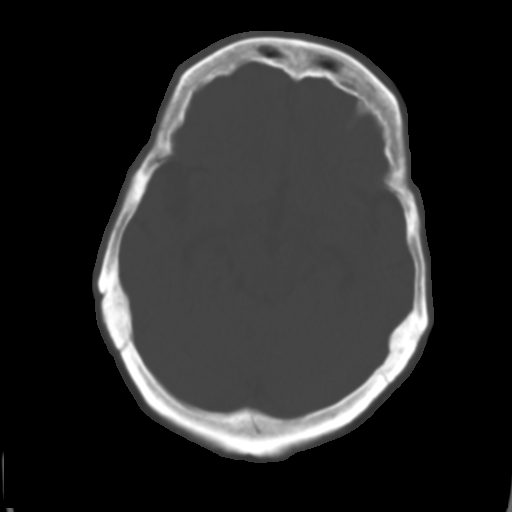
[im 20/30  brain]
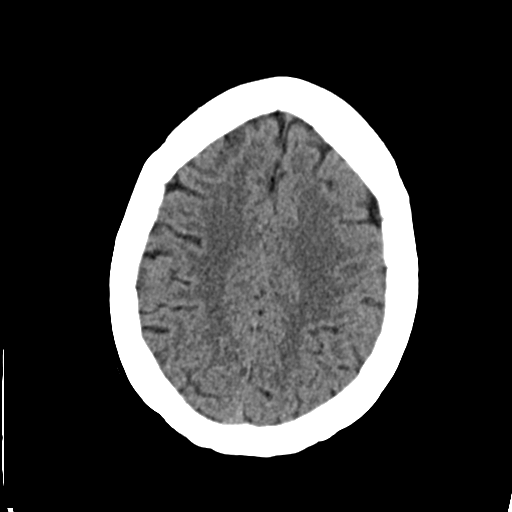

[Series 4: head wo · axial · 0.44mm/px · z∈[-116,-66]mm · 2 of 30 slices shown (2 of 2)]
[im 10/30  brain]
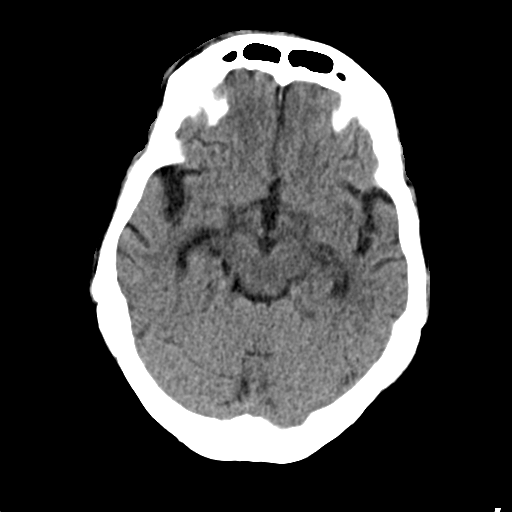
[im 20/30  brain]
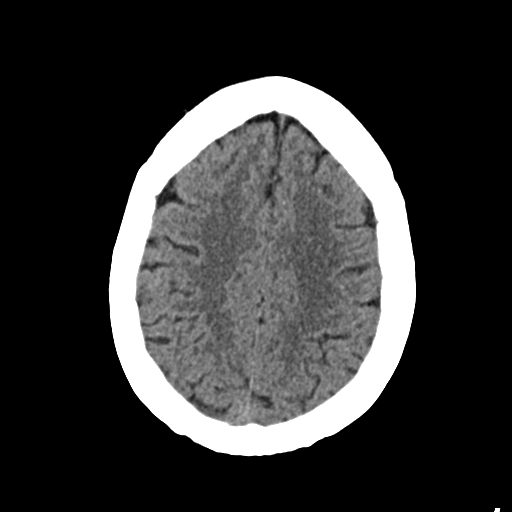

[Series 6: coronal soft tissue · coronal · 0.31mm/px · 2 of 68 slices shown]
[im 23/68  brain]
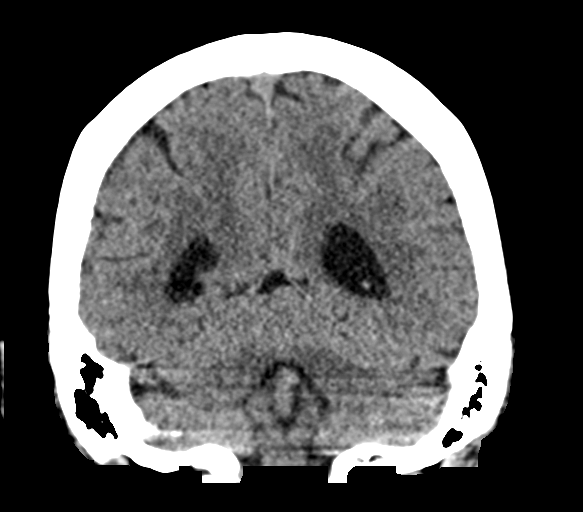
[im 45/68  brain]
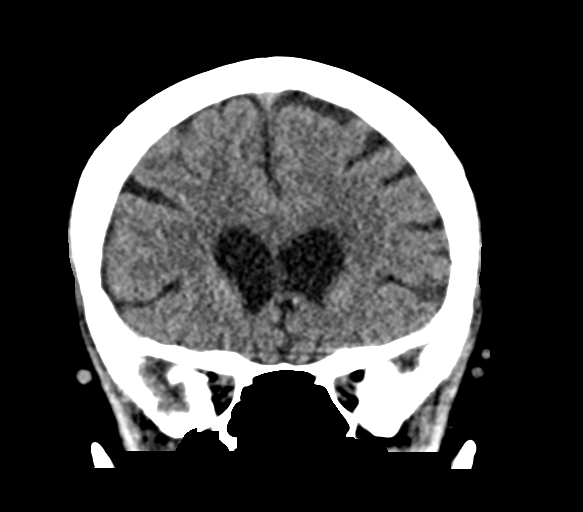

[Series 7: sagittal soft tissue · sagittal · 0.31mm/px · 3 of 59 slices shown]
[im 20/59  brain]
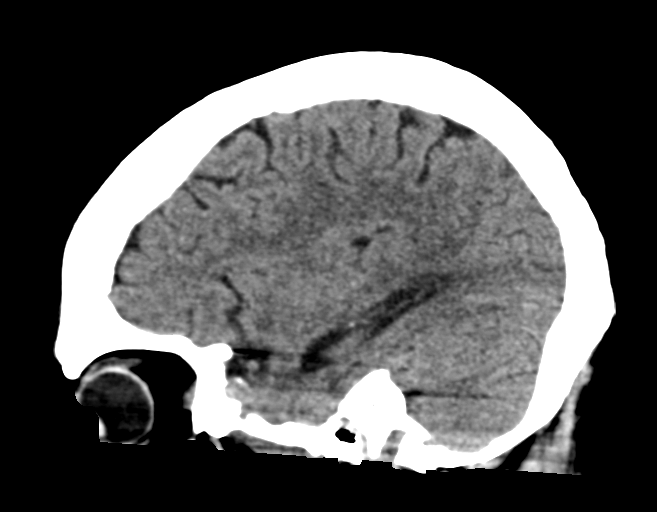
[im 30/59  brain]
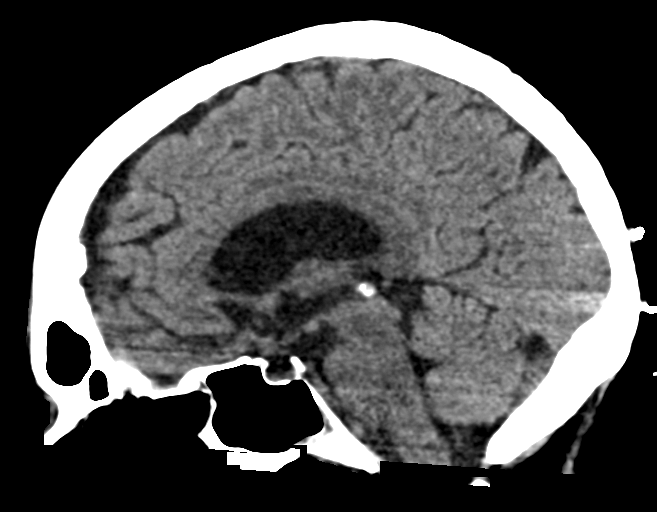
[im 39/59  brain]
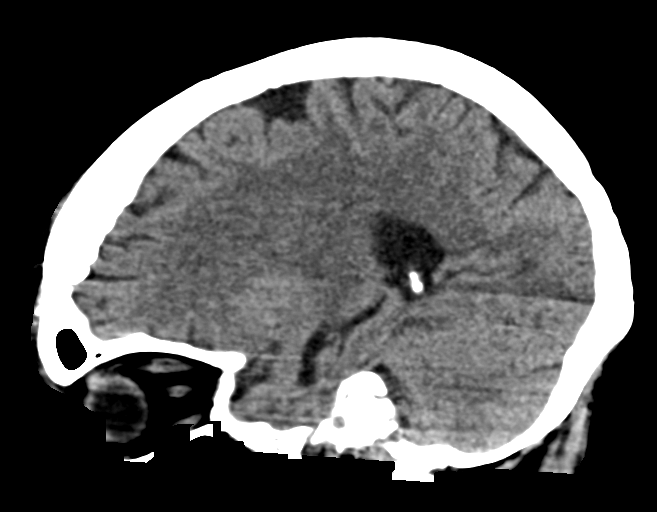

[17 of 47 positions shown; findings below may reference images not displayed]

FINDINGS: Brain: No acute infarct or hemorrhage. Lateral ventricles and
midline structures are unremarkable. No acute extra-axial fluid
collections. No mass effect.

Vascular: No hyperdense vessel or unexpected calcification.

Skull: Right supraorbital scalp laceration. No underlying fracture.
The remainder of the calvarium is unremarkable.

Sinuses/Orbits: No acute finding.

Other: None.
IMPRESSION: 1. Right supraorbital scalp laceration.
2. No acute intracranial process.
